# Patient Record
Sex: Male | Born: 1985 | Race: Black or African American | Hispanic: No | Marital: Married | Smoking: Current every day smoker
Health system: Southern US, Community
[De-identification: ages and names within clinical notes are randomized; demographics above are authoritative.]

## PROBLEM LIST (undated history)

## (undated) DIAGNOSIS — I10 Essential (primary) hypertension: Secondary | ICD-10-CM

## (undated) DIAGNOSIS — M5020 Other cervical disc displacement, unspecified cervical region: Secondary | ICD-10-CM

## (undated) DIAGNOSIS — M1990 Unspecified osteoarthritis, unspecified site: Secondary | ICD-10-CM

## (undated) DIAGNOSIS — J449 Chronic obstructive pulmonary disease, unspecified: Secondary | ICD-10-CM

---

## 2010-01-11 ENCOUNTER — Emergency Department: Payer: Self-pay | Admitting: Emergency Medicine

## 2011-08-18 ENCOUNTER — Emergency Department: Payer: Self-pay | Admitting: Emergency Medicine

## 2012-01-14 ENCOUNTER — Emergency Department: Payer: Self-pay | Admitting: Emergency Medicine

## 2012-07-03 ENCOUNTER — Emergency Department: Payer: Self-pay | Admitting: Emergency Medicine

## 2013-01-19 ENCOUNTER — Emergency Department: Payer: Self-pay | Admitting: Emergency Medicine

## 2013-02-05 ENCOUNTER — Ambulatory Visit: Payer: Self-pay

## 2015-04-24 ENCOUNTER — Emergency Department
Admission: EM | Admit: 2015-04-24 | Discharge: 2015-04-24 | Disposition: A | Discharge status: Home / Self Care | Payer: Medicaid Other | Attending: Emergency Medicine | Admitting: Emergency Medicine

## 2015-04-24 ENCOUNTER — Encounter: Payer: Self-pay | Admitting: Emergency Medicine

## 2015-04-24 DIAGNOSIS — M25569 Pain in unspecified knee: Secondary | ICD-10-CM | POA: Diagnosis not present

## 2015-04-24 DIAGNOSIS — G8929 Other chronic pain: Secondary | ICD-10-CM | POA: Insufficient documentation

## 2015-04-24 DIAGNOSIS — F17210 Nicotine dependence, cigarettes, uncomplicated: Secondary | ICD-10-CM | POA: Diagnosis not present

## 2015-04-24 DIAGNOSIS — M549 Dorsalgia, unspecified: Secondary | ICD-10-CM | POA: Diagnosis present

## 2015-04-24 MED ORDER — ETODOLAC 400 MG PO TABS: 400.0000 mg | ORAL_TABLET | Freq: Two times a day (BID) | ORAL | Status: DC

## 2015-04-24 NOTE — Discharge Instructions (Signed)
Call for a follow-up appointment at Dr. Rondel BatonMiller's office for your chronic back pain. Begin taking etodolac twice a day with food.

## 2015-04-24 NOTE — ED Provider Notes (Signed)
[facility] Emergency Department Provider Note  ____________________________________________  Time seen: Approximately 7:20 AM  I have reviewed the triage vital signs and the nursing notes.   HISTORY  Chief Complaint Back Pain   HPI Joel Wood is a 30 y.o. male is here with complaint of back pain for 12 years. He states that he has been seeing Dr. Lacie ScottsNiemeyer at [facility] and was x-rayed in his office where he learned that he has ruptured disc in his back. Patient states that he has never had an MRI and was never referred to an orthopedist. He states that Dr. Lacie ScottsNiemeyer was working on his disability papers and also told him that he had bad knees causing his knee pain as well. Patient states that for the last 8 or 9 months he has not had any medication but has been taking his mother's Percocet as needed for back pain. He denies any urinary symptoms or history of kidney stones. There is been no paresthesias in his lower extremities, no incontinence of bowel or bladder. Patient continues to be ambulatory without assistance.Pain is rated 10 over 10 at present.   History reviewed. No pertinent past medical history.  There are no active problems to display for this patient.   History reviewed. No pertinent past surgical history.  Current Outpatient Rx  Name  Route  Sig  Dispense  Refill  . etodolac (LODINE) 400 MG tablet   Oral   Take 1 tablet (400 mg total) by mouth 2 (two) times daily.   20 tablet   0     Allergies Review of patient's allergies indicates no known allergies.  Family History  Problem Relation Age of Onset  . Diabetes Mother     Social History Social History  Substance Use Topics  . Smoking status: Current Every Day Smoker -- 0.20 packs/day    Types: Cigarettes  . Smokeless tobacco: None  . Alcohol Use: No    Review of Systems Constitutional: No fever/chills Cardiovascular: Denies chest pain. Respiratory: Denies  shortness of breath. Gastrointestinal: No abdominal pain.  No nausea, no vomiting.   Genitourinary: Negative for dysuria. Musculoskeletal: Positive for chronic back pain. Skin: Negative for rash. Neurological: Negative for headaches, focal weakness or numbness.  10-point ROS otherwise negative.  ____________________________________________   PHYSICAL EXAM:  VITAL SIGNS: ED Triage Vitals  Enc Vitals Group     BP 04/24/15 0643 118/76 mmHg     Pulse Rate 04/24/15 0643 59     Resp 04/24/15 0643 20     Temp 04/24/15 0643 98 F (36.7 C)     Temp Source 04/24/15 0643 Oral     SpO2 04/24/15 0643 100 %     Weight 04/24/15 0643 170 lb (77.111 kg)     Height 04/24/15 0643 5\' 8"  (1.727 m)     Head Cir --      Peak Flow --      Pain Score 04/24/15 0643 10     Pain Loc --      Pain Edu? --      Excl. in GC? --     Constitutional: Alert and oriented. Well appearing and in no acute distress. Eyes: Conjunctivae are normal. PERRL. EOMI. Head: Atraumatic. Nose: No congestion/rhinnorhea. Neck: No stridor.   Cardiovascular: Normal rate, regular rhythm. Grossly normal heart sounds.  Good peripheral circulation. Respiratory: Normal respiratory effort.  No retractions. Lungs CTAB. Musculoskeletal: Examination of the back there is no gross deformity. There is tenderness on  palpation of the L5-S1 and sacral area and paravertebral muscles. Range of motion is without any spasm seen. Straight leg raises were 90 with only knee discomfort. Normal gait was noted Neurologic:  Normal speech and language. No gross focal neurologic deficits are appreciated. No gait instability. Reflexes are 2+ bilaterally. Skin:  Skin is warm, dry and intact. No rash noted. Psychiatric: Mood and affect are normal. Speech and behavior are normal.  ____________________________________________   LABS (all labs ordered are listed, but only abnormal results are displayed)  Labs Reviewed - No data to  display  PROCEDURES  Procedure(s) performed: None  Critical Care performed: No  ____________________________________________   INITIAL IMPRESSION / ASSESSMENT AND PLAN / ED COURSE  Pertinent labs & imaging results that were available during my care of the patient were reviewed by me and considered in my medical decision making (see chart for details).  Patient was given a referral to Dr. Rondel Baton office for evaluation of his chronic back pain. He was given a prescription for etodolac 400 mg twice a day with food. ____________________________________________   FINAL CLINICAL IMPRESSION(S) / ED DIAGNOSES  Final diagnoses:  Chronic back pain greater than 3 months duration      Tommi Rumps, PA-C 04/24/15 0800  Jennye Moccasin, MD 04/24/15 [phone]

## 2015-04-24 NOTE — ED Notes (Signed)
Pt states he has hx of ruptured disc in his back and has been having back problems for several years. Knee pain is also chronic for the past 5 years and was told he had arthritis. Pt has not taken anything for pain.

## 2015-04-24 NOTE — ED Notes (Signed)
Pt. States lower lt. Back pain and bilateral knee pain.  Pt. States long hx of lower back pain and knee pain.

## 2015-11-15 ENCOUNTER — Other Ambulatory Visit: Payer: Self-pay | Admitting: Obstetrics and Gynecology

## 2015-11-15 ENCOUNTER — Ambulatory Visit
Admission: RE | Admit: 2015-11-15 | Discharge: 2015-11-15 | Disposition: A | Discharge status: Home / Self Care | Payer: Disability Insurance | Source: Ambulatory Visit | Attending: Obstetrics and Gynecology | Admitting: Obstetrics and Gynecology

## 2015-11-15 DIAGNOSIS — M545 Low back pain: Secondary | ICD-10-CM | POA: Diagnosis present

## 2015-11-15 DIAGNOSIS — M25569 Pain in unspecified knee: Secondary | ICD-10-CM | POA: Diagnosis not present

## 2015-11-15 DIAGNOSIS — M259 Joint disorder, unspecified: Secondary | ICD-10-CM

## 2015-11-15 DIAGNOSIS — M5126 Other intervertebral disc displacement, lumbar region: Secondary | ICD-10-CM

## 2016-04-20 ENCOUNTER — Emergency Department: Payer: Medicaid Other

## 2016-04-20 ENCOUNTER — Emergency Department
Admission: EM | Admit: 2016-04-20 | Discharge: 2016-04-20 | Disposition: A | Discharge status: Home / Self Care | Payer: Medicaid Other | Attending: Emergency Medicine | Admitting: Emergency Medicine

## 2016-04-20 DIAGNOSIS — Y9389 Activity, other specified: Secondary | ICD-10-CM | POA: Insufficient documentation

## 2016-04-20 DIAGNOSIS — Y999 Unspecified external cause status: Secondary | ICD-10-CM | POA: Diagnosis not present

## 2016-04-20 DIAGNOSIS — X500XXA Overexertion from strenuous movement or load, initial encounter: Secondary | ICD-10-CM | POA: Insufficient documentation

## 2016-04-20 DIAGNOSIS — J449 Chronic obstructive pulmonary disease, unspecified: Secondary | ICD-10-CM | POA: Diagnosis not present

## 2016-04-20 DIAGNOSIS — F17210 Nicotine dependence, cigarettes, uncomplicated: Secondary | ICD-10-CM | POA: Insufficient documentation

## 2016-04-20 DIAGNOSIS — S39012A Strain of muscle, fascia and tendon of lower back, initial encounter: Secondary | ICD-10-CM | POA: Diagnosis not present

## 2016-04-20 DIAGNOSIS — K560 Paralytic ileus: Secondary | ICD-10-CM | POA: Diagnosis not present

## 2016-04-20 DIAGNOSIS — Y929 Unspecified place or not applicable: Secondary | ICD-10-CM | POA: Diagnosis not present

## 2016-04-20 DIAGNOSIS — I10 Essential (primary) hypertension: Secondary | ICD-10-CM | POA: Insufficient documentation

## 2016-04-20 DIAGNOSIS — S3992XA Unspecified injury of lower back, initial encounter: Secondary | ICD-10-CM | POA: Diagnosis present

## 2016-04-20 DIAGNOSIS — M62830 Muscle spasm of back: Secondary | ICD-10-CM

## 2016-04-20 HISTORY — DX: Unspecified osteoarthritis, unspecified site: M19.90

## 2016-04-20 HISTORY — DX: Other cervical disc displacement, unspecified cervical region: M50.20

## 2016-04-20 HISTORY — DX: Essential (primary) hypertension: I10

## 2016-04-20 HISTORY — DX: Chronic obstructive pulmonary disease, unspecified: J44.9

## 2016-04-20 MED ORDER — KETOROLAC TROMETHAMINE 30 MG/ML IJ SOLN
30.0000 mg | Freq: Once | INTRAMUSCULAR | Status: AC
Start: 2016-04-20 — End: 2016-04-20
  Administered 2016-04-20: 30 mg via INTRAMUSCULAR
  Filled 2016-04-20: qty 1

## 2016-04-20 MED ORDER — POLYETHYLENE GLYCOL 3350 17 G PO PACK: 17.0000 g | PACK | Freq: Every day | ORAL | 0 refills | Status: DC

## 2016-04-20 MED ORDER — CYCLOBENZAPRINE HCL 10 MG PO TABS: 10.0000 mg | ORAL_TABLET | Freq: Three times a day (TID) | ORAL | 0 refills | Status: DC | PRN

## 2016-04-20 MED ORDER — NAPROXEN 500 MG PO TABS: 500.0000 mg | ORAL_TABLET | Freq: Two times a day (BID) | ORAL | 0 refills | Status: DC

## 2016-04-20 NOTE — ED Triage Notes (Signed)
Pt came to ED c.o right lower back pain. Reports history of a ruptured disc. Pt reports moving 2 days ago, lifting heavy objects. Rates pain 8/10.

## 2016-04-20 NOTE — ED Provider Notes (Signed)
[facility] Emergency Department Provider Note  ____________________________________________  Time seen: Approximately 12:01 PM  I have reviewed the triage vital signs and the nursing notes.   HISTORY  Chief Complaint Back Pain    HPI Joel Wood is a 31 y.o. male , NAD, presents emergency Department with 2 day history of lower back pain. Patient states he has chronic lower back pain due to a ruptured disc some time ago. States he was moving 2 days ago when lifting heavy objects and noted pain in his lower back. States pain has persisted over the last 2 days and is worsened today. Denies any numbness, weakness, tingling. Pain does not radiate. Has not had any cell paresthesias or loss of bowel or bladder control. Has not taken anything over-the-counter for his pain. Denies traumas or falls. No rashes, redness or swelling. Denies chest pain or shortness of breath.   Past Medical History:  Diagnosis Date  . Arthritis   . COPD (chronic obstructive pulmonary disease) (HCC)   . Hypertension   . Ruptured disc, cervical     There are no active problems to display for this patient.   History reviewed. No pertinent surgical history.  Prior to Admission medications   Medication Sig Start Date End Date Taking? Authorizing Provider  cyclobenzaprine (FLEXERIL) 10 MG tablet Take 1 tablet (10 mg total) by mouth 3 (three) times daily as needed for muscle spasms. 04/20/16   Lodie Waheed L Jaiyden Laur, PA-C  etodolac (LODINE) 400 MG tablet Take 1 tablet (400 mg total) by mouth 2 (two) times daily. 04/24/15   Tommi Rumps, PA-C  naproxen (NAPROSYN) 500 MG tablet Take 1 tablet (500 mg total) by mouth 2 (two) times daily with a meal. 04/20/16   Elaina Cara L Ayva Veilleux, PA-C  polyethylene glycol (MIRALAX / GLYCOLAX) packet Take 17 g by mouth daily. 04/20/16   Lovene Maret L Arleny Kruger, PA-C    Allergies Patient has no known allergies.  Family History  Problem Relation Age of Onset  . Diabetes Mother      Social History Social History  Substance Use Topics  . Smoking status: Current Every Day Smoker    Packs/day: 1.00    Types: Cigarettes  . Smokeless tobacco: Never Used  . Alcohol use Yes     Comment: reports drinks hard liquior every other day     Review of Systems  Constitutional: No fever/chills Cardiovascular: No chest pain. Respiratory: No shortness of breath. No wheezing.  Gastrointestinal: No abdominal pain, Distention.  No nausea, vomiting.  No diarrhea, constipation. Genitourinary: Negative for dysuria, hematuria. No urinary hesitancy, urgency or increased frequency. Musculoskeletal: Positive for back pain.  Skin: Negative for rash, redness, swelling, bruising, open wounds or lacerations. Neurological: Negative for numbness, weakness, tingling. No saddle paresthesias or loss of bowel or bladder control. 10-point ROS otherwise negative.  ____________________________________________   PHYSICAL EXAM:  VITAL SIGNS: ED Triage Vitals [04/20/16 1038]  Enc Vitals Group     BP (!) 142/87     Pulse Rate 93     Resp 16     Temp 97.5 F (36.4 C)     Temp Source Oral     SpO2 99 %     Weight 180 lb (81.6 kg)     Height 5\' 8"  (1.727 m)     Head Circumference      Peak Flow      Pain Score      Pain Loc      Pain Edu?  Excl. in GC?     Constitutional: Alert and oriented. Well appearing and in no acute distress. Eyes: Conjunctivae are normal.  Head: Atraumatic. Neck: Supple with full range of motion Hematological/Lymphatic/Immunilogical: No cervical lymphadenopathy. Cardiovascular: Normal rate, regular rhythm. Normal S1 and S2.  Good peripheral circulation. Respiratory: Normal respiratory effort without tachypnea or retractions. Lungs CTAB with breath sounds noted in all lung fields. No wheeze, rhonchi, rales. Gastrointestinal: Soft and nontender without distention or guarding in all quadrants. Bowel sounds present and normoactive in all quadrants. No masses  or hepatosplenomegaly. Musculoskeletal: No midline thoracic or lumbar tenderness to palpation. No step-offs or deformities. Muscle spasms are noted about the lateral right lumbar area. Decreased lumbar flexion to about the knees due to pain. No lower extremity tenderness nor edema.  No joint effusions. Neurologic:  Normal speech and language. No gross focal neurologic deficits are appreciated.  Skin:  Skin is warm, dry and intact. No rash, redness, swelling, abnormal warmth, skin sores noted. Psychiatric: Mood and affect are normal. Speech and behavior are normal. Patient exhibits appropriate insight and judgement.   ____________________________________________   LABS  None ____________________________________________  EKG  None ____________________________________________  RADIOLOGY I, Hope PigeonJami L Pennye Beeghly, personally viewed and evaluated these images (plain radiographs) as part of my medical decision making, as well as reviewing the written report by the radiologist.  Dg Lumbar Spine 2-3 Views  Result Date: 04/20/2016 CLINICAL DATA:  Pt came to ED c.o right lower back pain. Reports history of a ruptured disc. Pt reports moving 2 days ago, lifting heavy objects. Rates pain 8/10. EXAM: LUMBAR SPINE - 2-3 VIEW COMPARISON:  11/15/2015 FINDINGS: No fracture.  No spondylolisthesis.  No bone lesion. The disc spaces are well maintained. There are no degenerative changes. There are scattered air-fluid levels in nondistended bowel. This suggests a mild adynamic ileus. Soft tissues are otherwise unremarkable. IMPRESSION: No abnormality of the lumbar spine. Scattered bowel air-fluid levels suggests a mild adynamic ileus. No evidence of bowel obstruction. Electronically Signed   By: Amie Portlandavid  Ormond M.D.   On: 04/20/2016 12:17    ____________________________________________    PROCEDURES  Procedure(s) performed: None   Procedures   Medications  ketorolac (TORADOL) 30 MG/ML injection 30 mg (30 mg  Intramuscular Given 04/20/16 1303)     ____________________________________________   INITIAL IMPRESSION / ASSESSMENT AND PLAN / ED COURSE  Pertinent labs & imaging results that were available during my care of the patient were reviewed by me and considered in my medical decision making (see chart for details).  Clinical Course as of Apr 20 1342  Fri Apr 20, 2016  1300 Lumbar x-ray resulted with radiology reporting adynamic ileus. Patient notes he has had no abdominal pain, distention, difficulty with bowel movements, diarrhea, nausea, vomiting or difficulty eating or drinking. Does note that he has chronic increased bowel gas and passes gas regularly. Denies any recent surgeries or anesthesia events. No injuries or traumas to the chest or abdomen. Discussed the imaging findings with the patient and that he should have close follow-up with a gastroenterologist and patient was given strict return precautions and the case any worsening or new onset symptoms.  [JH]  1315 I spoke with Dr. Mayford KnifeWilliams, attending physician in regards to the patient's imaging results and physical exam. He agrees that since the patient is not having any acute symptoms at this time that he may be discharged and is to follow up with GI or return to this emergency department if any symptoms arise.   [  JH]    Clinical Course User Index [JH] Itha Kroeker L Corianne Buccellato, PA-C    Patient's diagnosis is consistent with Lumbar strain with muscle spasm of the back and adynamic ileus seen on x-ray. Patient will be discharged home with prescriptions for the external, Naprosyn and MiraLAX to take as directed. Patient is follow-up with GI as needed for follow-up of ileus or may return to this emergency department for any worsening or new onset of symptoms. Patient is to follow up with Dr. Martha Clan in orthopedics if lower back pain persists past this treatment course. Patient is given ED precautions to return to the ED for any worsening or new  symptoms.   ____________________________________________  FINAL CLINICAL IMPRESSION(S) / ED DIAGNOSES  Final diagnoses:  Strain of lumbar region, initial encounter  Adynamic ileus (HCC)  Muscle spasm of back      NEW MEDICATIONS STARTED DURING THIS VISIT:  Discharge Medication List as of 04/20/2016  1:17 PM    START taking these medications   Details  cyclobenzaprine (FLEXERIL) 10 MG tablet Take 1 tablet (10 mg total) by mouth 3 (three) times daily as needed for muscle spasms., Starting Fri 04/20/2016, Print    naproxen (NAPROSYN) 500 MG tablet Take 1 tablet (500 mg total) by mouth 2 (two) times daily with a meal., Starting Fri 04/20/2016, Print    polyethylene glycol (MIRALAX / GLYCOLAX) packet Take 17 g by mouth daily., Starting Fri 04/20/2016, Print             Ernestene Kiel Bangs, PA-C 04/20/16 1344    Emily Filbert, MD 04/20/16 1534

## 2017-11-13 IMAGING — CR DG LUMBAR SPINE 2-3V
2 series · 2 of 2 positions shown · non-contrast
Comparison: 11/15/2015

CLINICAL DATA: Pt came to ED c.o right lower back pain. Reports
history of a ruptured disc. Pt reports moving 2 days ago, lifting
heavy objects. Rates pain [DATE].

EXAM:
LUMBAR SPINE - 2-3 VIEW

[l-spine ap]
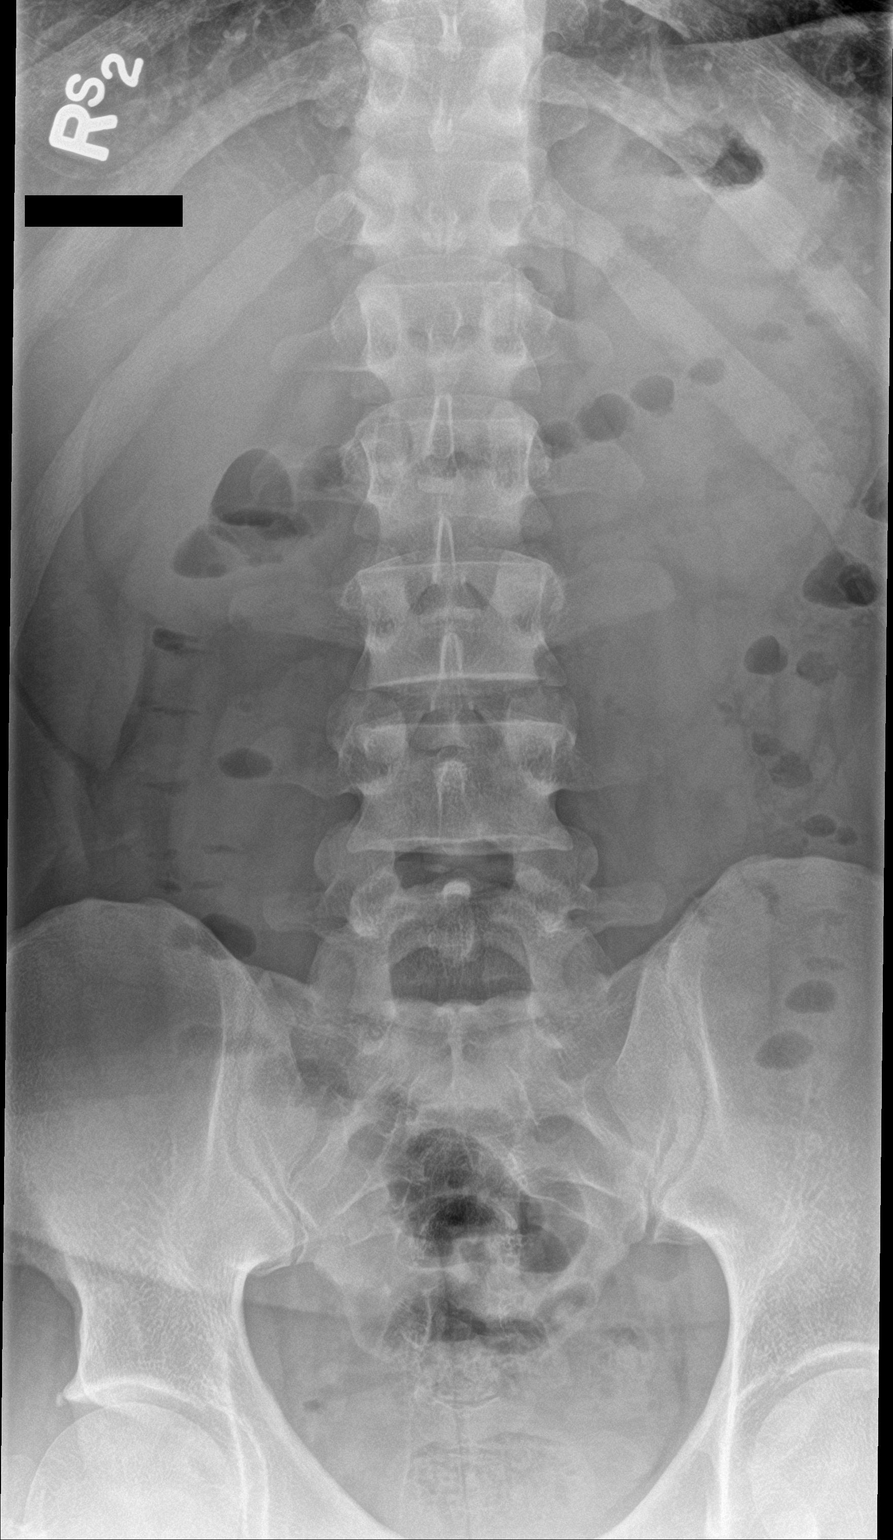

[l-spine lat]
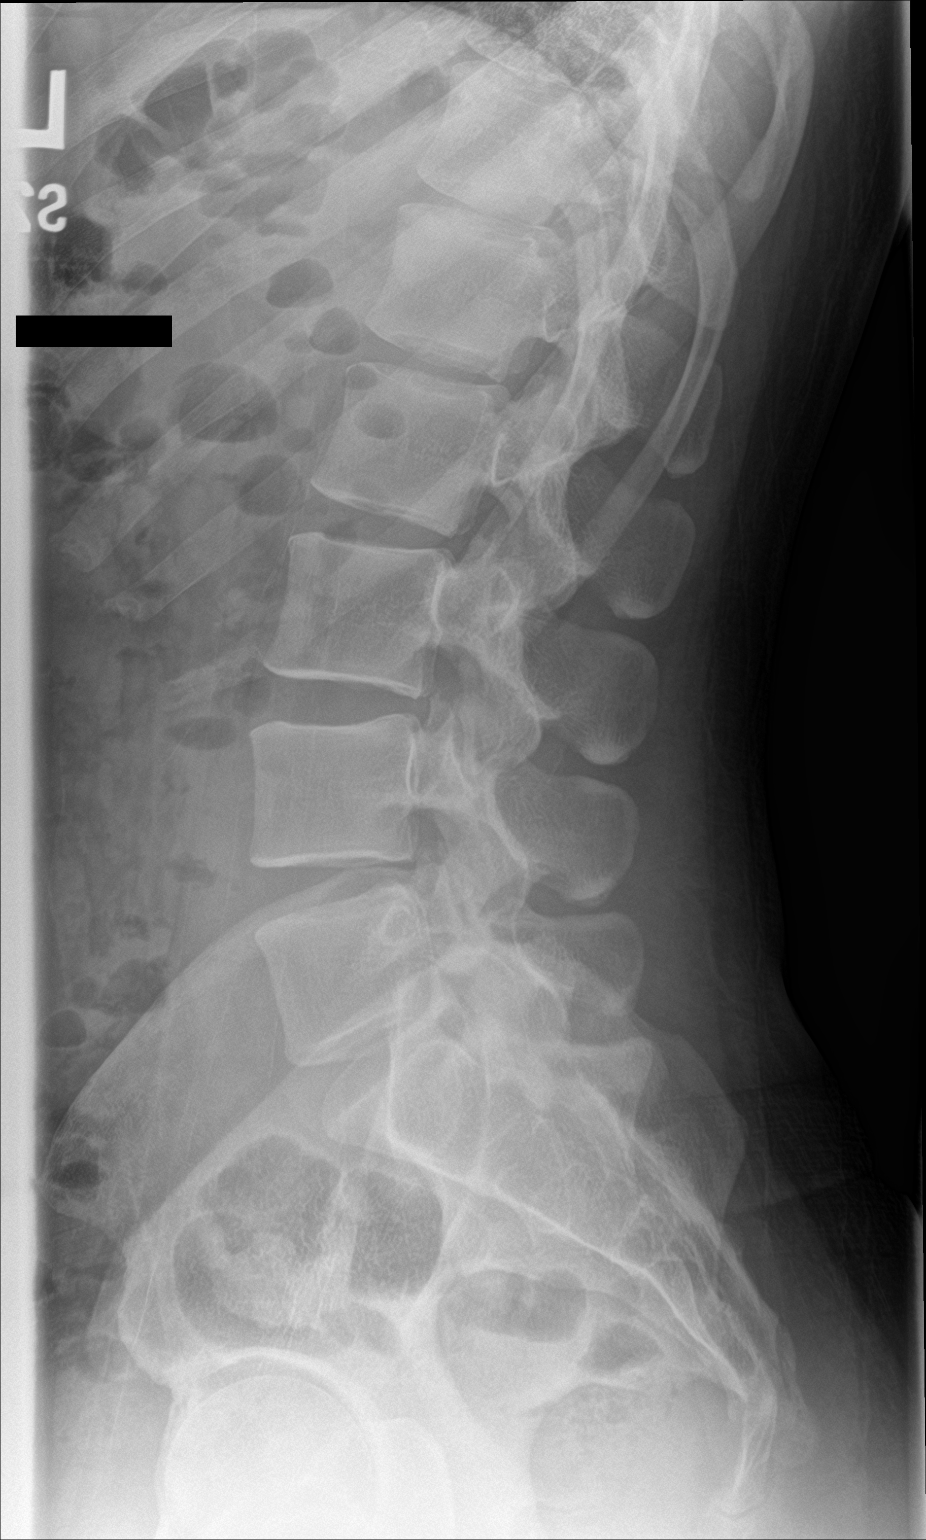

[2 of 2 positions shown; findings below may reference images not displayed]

FINDINGS: No fracture.  No spondylolisthesis.  No bone lesion.

The disc spaces are well maintained. There are no degenerative
changes.

There are scattered air-fluid levels in nondistended bowel. This
suggests a mild adynamic ileus. Soft tissues are otherwise
unremarkable.
IMPRESSION: No abnormality of the lumbar spine. Scattered bowel air-fluid levels
suggests a mild adynamic ileus. No evidence of bowel obstruction.

## 2017-12-04 ENCOUNTER — Emergency Department
Admission: EM | Admit: 2017-12-04 | Discharge: 2017-12-04 | Disposition: A | Discharge status: Home / Self Care | Payer: Medicaid Other | Attending: Emergency Medicine | Admitting: Emergency Medicine

## 2017-12-04 ENCOUNTER — Encounter: Payer: Self-pay | Admitting: Emergency Medicine

## 2017-12-04 ENCOUNTER — Other Ambulatory Visit: Payer: Self-pay

## 2017-12-04 ENCOUNTER — Emergency Department: Payer: Medicaid Other

## 2017-12-04 DIAGNOSIS — Z79899 Other long term (current) drug therapy: Secondary | ICD-10-CM | POA: Diagnosis not present

## 2017-12-04 DIAGNOSIS — S149XXA Injury of unspecified nerves of neck, initial encounter: Secondary | ICD-10-CM | POA: Diagnosis not present

## 2017-12-04 DIAGNOSIS — Y929 Unspecified place or not applicable: Secondary | ICD-10-CM | POA: Diagnosis not present

## 2017-12-04 DIAGNOSIS — Y939 Activity, unspecified: Secondary | ICD-10-CM | POA: Diagnosis not present

## 2017-12-04 DIAGNOSIS — F17210 Nicotine dependence, cigarettes, uncomplicated: Secondary | ICD-10-CM | POA: Insufficient documentation

## 2017-12-04 DIAGNOSIS — X58XXXA Exposure to other specified factors, initial encounter: Secondary | ICD-10-CM | POA: Insufficient documentation

## 2017-12-04 DIAGNOSIS — J449 Chronic obstructive pulmonary disease, unspecified: Secondary | ICD-10-CM | POA: Diagnosis not present

## 2017-12-04 DIAGNOSIS — I10 Essential (primary) hypertension: Secondary | ICD-10-CM | POA: Diagnosis not present

## 2017-12-04 DIAGNOSIS — G589 Mononeuropathy, unspecified: Secondary | ICD-10-CM

## 2017-12-04 DIAGNOSIS — Y999 Unspecified external cause status: Secondary | ICD-10-CM | POA: Insufficient documentation

## 2017-12-04 DIAGNOSIS — S199XXA Unspecified injury of neck, initial encounter: Secondary | ICD-10-CM | POA: Diagnosis present

## 2017-12-04 MED ORDER — CYCLOBENZAPRINE HCL 10 MG PO TABS: 10.0000 mg | ORAL_TABLET | Freq: Three times a day (TID) | ORAL | 0 refills | Status: DC | PRN

## 2017-12-04 MED ORDER — METHYLPREDNISOLONE 4 MG PO TBPK: ORAL_TABLET | ORAL | 0 refills | Status: DC

## 2017-12-04 NOTE — ED Provider Notes (Signed)
[facility] Emergency Department Provider Note   ____________________________________________   First MD Initiated Contact with Patient 12/04/17 0830     (approximate)  I have reviewed the triage vital signs and the nursing notes.   HISTORY  Chief Complaint Neck Pain    HPI Joel Wood is a 32 y.o. male patient complain of neck pain for few weeks.  Patient states 3 days ago he started noticing right upper extremity weakness.  Patient yesterday he almost dropped his daughter while holding her in his right arms.  Patient denies provocative incident for complaint.  Patient rates pain discomfort a 7/10.  No palliative measures for complaint.   Past Medical History:  Diagnosis Date  . Arthritis   . COPD (chronic obstructive pulmonary disease) (HCC)   . Hypertension   . Ruptured disc, cervical     There are no active problems to display for this patient.   History reviewed. No pertinent surgical history.  Prior to Admission medications   Medication Sig Start Date End Date Taking? Authorizing Provider  cyclobenzaprine (FLEXERIL) 10 MG tablet Take 1 tablet (10 mg total) by mouth 3 (three) times daily as needed for muscle spasms. 04/20/16   Hagler, Jami L, PA-C  cyclobenzaprine (FLEXERIL) 10 MG tablet Take 1 tablet (10 mg total) by mouth 3 (three) times daily as needed. 12/04/17   Joni ReiningSmith, Ronald K, PA-C  etodolac (LODINE) 400 MG tablet Take 1 tablet (400 mg total) by mouth 2 (two) times daily. 04/24/15   Tommi RumpsSummers, Rhonda L, PA-C  methylPREDNISolone (MEDROL DOSEPAK) 4 MG TBPK tablet Take Tapered dose as directed 12/04/17   Joni ReiningSmith, Ronald K, PA-C  naproxen (NAPROSYN) 500 MG tablet Take 1 tablet (500 mg total) by mouth 2 (two) times daily with a meal. 04/20/16   Hagler, Jami L, PA-C  polyethylene glycol (MIRALAX / GLYCOLAX) packet Take 17 g by mouth daily. 04/20/16   Hagler, Jami L, PA-C    Allergies Patient has no known allergies.  Family History  Problem  Relation Age of Onset  . Diabetes Mother     Social History Social History   Tobacco Use  . Smoking status: Current Every Day Smoker    Packs/day: 1.00    Types: Cigarettes  . Smokeless tobacco: Never Used  Substance Use Topics  . Alcohol use: Yes    Comment: reports drinks hard liquior every other day  . Drug use: No    Review of Systems Constitutional: No fever/chills Eyes: No visual changes. ENT: No sore throat. Cardiovascular: Denies chest pain. Respiratory: Denies shortness of breath. Gastrointestinal: No abdominal pain.  No nausea, no vomiting.  No diarrhea.  No constipation. Genitourinary: Negative for dysuria. Musculoskeletal: Neck and right shoulder pain. Skin: Negative for rash. Neurological: Negative for headaches, focal weakness or numbness.   ____________________________________________   PHYSICAL EXAM:  VITAL SIGNS: ED Triage Vitals [12/04/17 0827]  Enc Vitals Group     BP      Pulse      Resp      Temp      Temp src      SpO2      Weight 196 lb (88.9 kg)     Height 5\' 8"  (1.727 m)     Head Circumference      Peak Flow      Pain Score 7     Pain Loc      Pain Edu?      Excl. in GC?    Constitutional:  Alert and oriented. Well appearing and in no acute distress. Neck: No cervical spine tenderness to palpation. Hematological/Lymphatic/Immunilogical: No cervical lymphadenopathy. Cardiovascular: Normal rate, regular rhythm. Grossly normal heart sounds.  Good peripheral circulation. Respiratory: Normal respiratory effort.  No retractions. Lungs CTAB. Gastrointestinal: Soft and nontender. No distention. No abdominal bruits. No CVA tenderness. Musculoskeletal: No obvious deformities to cervical spine or right shoulder.  Patient is full equal range of motion of the neck and right upper extremity. Neurologic:  Normal speech and language. No gross focal neurologic deficits are appreciated. No gait instability. Skin:  Skin is warm, dry and intact. No  rash noted. Psychiatric: Mood and affect are normal. Speech and behavior are normal.  ____________________________________________   LABS (all labs ordered are listed, but only abnormal results are displayed)  Labs Reviewed - No data to display ____________________________________________  EKG   ____________________________________________  RADIOLOGY  ED MD interpretation:    Official radiology report(s): Dg Cervical Spine Complete  Result Date: 12/04/2017 CLINICAL DATA:  Pt reports shooting pains from his neck into his right shoulder for the past three weeks. Pt had difficulty turn his head to the left and states that he felt more pain doing that, as well as, numbness that started in his right ri.*comment was truncated* EXAM: CERVICAL SPINE - COMPLETE 4+ VIEW COMPARISON:  None. FINDINGS: Normal alignment of the vertebral bodies. No prevertebral soft tissue swelling. No loss of disc space height or vertebral body height. Normal spinal laminal line. Oblique projection demonstrates mild neural foraminal narrowing on the RIGHT at C4-C5. No neural foraminal narrowing on the LEFT. Open mouth odontoid view is normal. IMPRESSION: 1. No acute or traumatic injury to the cervical spine. 2. Neural foraminal narrowing on the RIGHT at C4-C5. Electronically Signed   By: Genevive Bi M.D.   On: 12/04/2017 09:48    ____________________________________________   PROCEDURES  Procedure(s) performed: None  Procedures  Critical Care performed: No  ____________________________________________   INITIAL IMPRESSION / ASSESSMENT AND PLAN / ED COURSE  As part of my medical decision making, I reviewed the following data within the electronic MEDICAL RECORD NUMBER    Cervical radiculopathy to the right upper extremity.  Discussed x-ray findings with patient.  Patient given discharge care instruction.  Patient advised to take medication as directed.  Patient advised follow-up PCP if condition  persists or recurs.      ____________________________________________   FINAL CLINICAL IMPRESSION(S) / ED DIAGNOSES  Final diagnoses:  Pinched nerve in neck     ED Discharge Orders         Ordered    methylPREDNISolone (MEDROL DOSEPAK) 4 MG TBPK tablet     12/04/17 1002    cyclobenzaprine (FLEXERIL) 10 MG tablet  3 times daily PRN     12/04/17 1002           Note:  This document was prepared using Dragon voice recognition software and may include unintentional dictation errors.    Joni Reining, PA-C 12/04/17 1006    Emily Filbert, MD 12/04/17 1227

## 2017-12-04 NOTE — ED Triage Notes (Signed)
Pt here with c/o neck and right shoulder pain that began a few weeks ago, appears in NAD.

## 2017-12-04 NOTE — ED Notes (Signed)
See triage note states he developed neck pain which is moving into right shoulder  Pain increases during the day   Denies any trauma

## 2018-12-24 ENCOUNTER — Ambulatory Visit: Payer: Self-pay | Admitting: Family Medicine

## 2022-03-28 ENCOUNTER — Emergency Department
Admission: EM | Admit: 2022-03-28 | Discharge: 2022-03-28 | Disposition: A | Discharge status: Home / Self Care | Payer: Medicaid Other | Attending: Emergency Medicine | Admitting: Emergency Medicine

## 2022-03-28 ENCOUNTER — Other Ambulatory Visit: Payer: Self-pay

## 2022-03-28 ENCOUNTER — Encounter: Payer: Self-pay | Admitting: Emergency Medicine

## 2022-03-28 ENCOUNTER — Emergency Department: Payer: Medicaid Other

## 2022-03-28 DIAGNOSIS — J449 Chronic obstructive pulmonary disease, unspecified: Secondary | ICD-10-CM | POA: Insufficient documentation

## 2022-03-28 DIAGNOSIS — I10 Essential (primary) hypertension: Secondary | ICD-10-CM | POA: Diagnosis not present

## 2022-03-28 DIAGNOSIS — M545 Low back pain, unspecified: Secondary | ICD-10-CM

## 2022-03-28 MED ORDER — PREDNISONE 10 MG PO TABS: ORAL_TABLET | ORAL | 0 refills | Status: DC

## 2022-03-28 MED ORDER — KETOROLAC TROMETHAMINE 30 MG/ML IJ SOLN
30.0000 mg | Freq: Once | INTRAMUSCULAR | Status: AC
  Administered 2022-03-28: 30 mg via INTRAMUSCULAR
  Filled 2022-03-28: qty 1
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
## 2022-03-28 NOTE — ED Triage Notes (Signed)
Patient ambulatory to triage with steady gait, without difficulty or distress noted; pt reports lower back pain, nonradiating with no accomp symptoms; st hx of same
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
## 2022-05-09 ENCOUNTER — Other Ambulatory Visit: Payer: Self-pay

## 2022-05-09 ENCOUNTER — Ambulatory Visit
Admission: RE | Admit: 2022-05-09 | Discharge: 2022-05-09 | Disposition: A | Discharge status: Home / Self Care | Payer: Self-pay | Source: Ambulatory Visit | Attending: Orthopedic Surgery | Admitting: Orthopedic Surgery

## 2022-05-09 DIAGNOSIS — Z049 Encounter for examination and observation for unspecified reason: Secondary | ICD-10-CM
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
## 2022-05-11 ENCOUNTER — Telehealth: Payer: Self-pay

## 2022-05-11 NOTE — Telephone Encounter (Signed)
I think Joel Wood will want the reports prior to his appointment. Thanks

## 2022-05-11 NOTE — Telephone Encounter (Signed)
-----   Message from Peggyann Shoals sent at 05/11/2022  9:30 AM EST ----- Regarding: RE: MRI reports The imaging office told me that the patient had to give them person to release the reports. He could do that via email to [email or web address]. I left the patient a  message with the instructions or we can just wait and fax a release when he is here. ----- Message ----- From: Peggyann Shoals Sent: 05/09/2022   3:38 PM EST To: Peggyann Shoals Subject: RE: MRI reports                                Left message requesting reports Phone: [phone] Fax: [phone] ----- Message ----- From: Berdine Addison, RN Sent: 05/09/2022   3:27 PM EST To: Peggyann Shoals Subject: MRI reports                                    Received his MRI thoracic and lumbar CD from 02/10/22. I downloaded them into Epic. I don't see a report for either study. I believe they were done at [facility] in [place]. Thanks

## 2022-05-14 NOTE — Telephone Encounter (Signed)
Reports are in his chart

## 2022-05-15 ENCOUNTER — Ambulatory Visit (INDEPENDENT_AMBULATORY_CARE_PROVIDER_SITE_OTHER): Payer: Medicaid Other | Admitting: Orthopedic Surgery

## 2022-05-15 ENCOUNTER — Encounter: Payer: Self-pay | Admitting: Orthopedic Surgery

## 2022-05-15 DIAGNOSIS — M5126 Other intervertebral disc displacement, lumbar region: Secondary | ICD-10-CM

## 2022-05-15 DIAGNOSIS — M25562 Pain in left knee: Secondary | ICD-10-CM | POA: Diagnosis not present

## 2022-05-15 DIAGNOSIS — M4726 Other spondylosis with radiculopathy, lumbar region: Secondary | ICD-10-CM | POA: Diagnosis not present

## 2022-05-15 DIAGNOSIS — M5416 Radiculopathy, lumbar region: Secondary | ICD-10-CM

## 2022-05-15 DIAGNOSIS — M47816 Spondylosis without myelopathy or radiculopathy, lumbar region: Secondary | ICD-10-CM

## 2022-05-15 NOTE — Patient Instructions (Signed)
It was so nice to see you today. Thank you so much for coming in.    You have a disc bulge at L5-S1 that is likely causing your pain. I think some of your pain is from your left knee as well.   I sent physical therapy orders to [facility]. You can call them at [phone] if you don't hear from them to schedule your visit. They should be able to get you a TENs unit.   I will see you back in 6 weeks. Please do not hesitate to call if you have any questions or concerns. You can also message me in [place].   If you have not heard back about any of the above things in the next week, please call the office so we can help you get them scheduled.   Geronimo Boot PA-C [phone]
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
## 2022-07-03 ENCOUNTER — Encounter: Payer: Self-pay | Admitting: Orthopedic Surgery

## 2022-07-03 ENCOUNTER — Ambulatory Visit (INDEPENDENT_AMBULATORY_CARE_PROVIDER_SITE_OTHER): Payer: BLUE CROSS/BLUE SHIELD | Admitting: Orthopedic Surgery

## 2022-07-03 VITALS — BP 130/77 | HR 91 | Ht 68.0 in | Wt 190.0 lb

## 2022-07-03 DIAGNOSIS — M47816 Spondylosis without myelopathy or radiculopathy, lumbar region: Secondary | ICD-10-CM

## 2022-07-03 DIAGNOSIS — M4726 Other spondylosis with radiculopathy, lumbar region: Secondary | ICD-10-CM | POA: Diagnosis not present

## 2022-07-03 DIAGNOSIS — M5416 Radiculopathy, lumbar region: Secondary | ICD-10-CM

## 2022-07-03 NOTE — Addendum Note (Signed)
Addended byGeronimo Boot on: 07/03/2022 09:59 AM   Modules accepted: Orders

## 2022-07-03 NOTE — Patient Instructions (Addendum)
It was so nice to see you today. Thank you so much for coming in.    I am sorry about your grandma.   I sent physical therapy orders to [facility]. You can call them at [phone] if you don't hear from them to schedule your visit. They should be able to get you a TENs unit.   Call ortho at [facility] for your left knee. I sent them a referral at your last visit. Their number is [phone].  I will see you back in 2-3 months. Please do not hesitate to call if you have any questions or concerns. You can also message me in [place].   Geronimo Boot PA-C [phone]
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
## 2022-09-11 ENCOUNTER — Ambulatory Visit: Payer: BLUE CROSS/BLUE SHIELD | Admitting: Orthopedic Surgery

## 2023-05-07 NOTE — Addendum Note (Signed)
Encounter addended by: Silvana Newness on: 05/07/2023 4:25 PM  Actions taken: Imaging Exam ended

## 2023-05-07 NOTE — Addendum Note (Signed)
Encounter addended by: Silvana Newness on: 05/07/2023 4:23 PM  Actions taken: Imaging Exam ended
